# Patient Record
Sex: Male | Born: 1990 | Race: White | Hispanic: Yes | Marital: Single | Smoking: Never smoker
Health system: Southern US, Community
[De-identification: ages and names within clinical notes are randomized; demographics above are authoritative.]

---
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
## 2020-05-14 ENCOUNTER — Other Ambulatory Visit: Payer: Self-pay

## 2020-05-14 ENCOUNTER — Emergency Department
Admission: EM | Admit: 2020-05-14 | Discharge: 2020-05-14 | Disposition: A | Discharge status: Home / Self Care | Payer: 59 | Attending: Emergency Medicine | Admitting: Emergency Medicine

## 2020-05-14 ENCOUNTER — Encounter: Payer: Self-pay | Admitting: Emergency Medicine

## 2020-05-14 DIAGNOSIS — Y93G3 Activity, cooking and baking: Secondary | ICD-10-CM | POA: Insufficient documentation

## 2020-05-14 DIAGNOSIS — Z23 Encounter for immunization: Secondary | ICD-10-CM | POA: Diagnosis not present

## 2020-05-14 DIAGNOSIS — S61012A Laceration without foreign body of left thumb without damage to nail, initial encounter: Secondary | ICD-10-CM | POA: Diagnosis not present

## 2020-05-14 DIAGNOSIS — W260XXA Contact with knife, initial encounter: Secondary | ICD-10-CM | POA: Diagnosis not present

## 2020-05-14 DIAGNOSIS — S6992XA Unspecified injury of left wrist, hand and finger(s), initial encounter: Secondary | ICD-10-CM | POA: Diagnosis present

## 2020-05-14 MED ORDER — TETANUS-DIPHTH-ACELL PERTUSSIS 5-2.5-18.5 LF-MCG/0.5 IM SUSY
0.5000 mL | PREFILLED_SYRINGE | Freq: Once | INTRAMUSCULAR | Status: AC
  Administered 2020-05-14: 0.5 mL via INTRAMUSCULAR
  Filled 2020-05-14: qty 0.5

## 2020-05-14 NOTE — ED Triage Notes (Signed)
Pt to ED from home c/o left thumb laceration with a knife tonight while cooking dinner.  Thumb wrapped and bleeding controlled in wrap at this time.  Out of date for tetanus shot.  Bleeding continues when unwrapped, tip of finger injured.

## 2020-05-14 NOTE — ED Provider Notes (Signed)
[facility]-EMERGENCY DEPARTMENT  ____________________________________________  Time seen: Approximately 9:41 PM  I have reviewed the triage vital signs and the nursing notes.   HISTORY  Chief Complaint Laceration   Historian Patient     HPI Gerald Gilbert is a 29 y.o. male presents to the emergency department with a 0.5 cm left thumb avulsion sustained accidentally with a clean knife while cooking dinner.  No numbness or tingling in the left hand.  Patient reports injury was accidental and he cannot remember his last tetanus shot.  No other alleviating measures have been attempted.   History reviewed. No pertinent past medical history.   Immunizations up to date:  Yes.     History reviewed. No pertinent past medical history.  There are no problems to display for this patient.   History reviewed. No pertinent surgical history.  Prior to Admission medications   Not on File    Allergies Patient has no known allergies.  History reviewed. No pertinent family history.  Social History Social History   Tobacco Use  . Smoking status: Never Smoker  . Smokeless tobacco: Never Used  Substance Use Topics  . Alcohol use: Yes    Comment: occ.  . Drug use: Never     Review of Systems  Constitutional: No fever/chills Eyes:  No discharge ENT: No upper respiratory complaints. Respiratory: no cough. No SOB/ use of accessory muscles to breath Gastrointestinal:   No nausea, no vomiting.  No diarrhea.  No constipation. Musculoskeletal: Negative for musculoskeletal pain. Skin: Patient has thumb avulsion.     ____________________________________________   PHYSICAL EXAM:  VITAL SIGNS: ED Triage Vitals  Enc Vitals Group     BP 05/14/20 1954 (!) 149/76     Pulse Rate 05/14/20 1954 67     Resp 05/14/20 1954 16     Temp 05/14/20 1954 99 F (37.2 C)     Temp Source 05/14/20 1954 Oral     SpO2 05/14/20 1954 98 %     Weight 05/14/20 1956 230 lb (104.3 kg)     Height  05/14/20 1956 5\' 7"  (1.702 m)     Head Circumference --      Peak Flow --      Pain Score 05/14/20 1955 5     Pain Loc --      Pain Edu? --      Excl. in GC? --      Constitutional: Alert and oriented. Well appearing and in no acute distress. Eyes: Conjunctivae are normal. PERRL. EOMI. Head: Atraumatic. Cardiovascular: Normal rate, regular rhythm. Normal S1 and S2.  Good peripheral circulation. Respiratory: Normal respiratory effort without tachypnea or retractions. Lungs CTAB. Good air entry to the bases with no decreased or absent breath sounds Gastrointestinal: Bowel sounds x 4 quadrants. Soft and nontender to palpation. No guarding or rigidity. No distention. Musculoskeletal: Full range of motion to all extremities. No obvious deformities noted. No flexor or extensor tendon deficits appreciated of the left thumb. Neurologic:  Normal for age. No gross focal neurologic deficits are appreciated.  Skin: Patient has a 0.5 cm left thumb avulsion. Laceration is deep to underlying dermis.  Palpable radial pulse, left.  Capillary refill less than 2 seconds on the left. Psychiatric: Mood and affect are normal for age. Speech and behavior are normal.   ____________________________________________   LABS (all labs ordered are listed, but only abnormal results are displayed)  Labs Reviewed - No data to display ____________________________________________  EKG   ____________________________________________  RADIOLOGY  No  results found.  ____________________________________________    PROCEDURES  Procedure(s) performed:     Marland KitchenMarland KitchenLaceration Repair  Date/Time: 05/14/2020 10:51 PM Performed by: Orvil Feil, PA-C Authorized by: Orvil Feil, PA-C   Consent:    Consent obtained:  Verbal   Consent given by:  Patient Anesthesia:    Anesthesia method:  None Laceration details:    Location:  Finger   Finger location:  L thumb   Length (cm):  0.5   Depth (mm):   5 Exploration:    Contaminated: no   Treatment:    Amount of cleaning:  Standard   Irrigation solution:  Sterile saline   Visualized foreign bodies/material removed: no   Skin repair:    Repair method:  Tissue adhesive Approximation:    Approximation:  Close Repair type:    Repair type:  Simple Post-procedure details:    Dressing:  Open (no dressing)   Procedure completion:  Tolerated well, no immediate complications       Medications  Tdap (BOOSTRIX) injection 0.5 mL (0.5 mLs Intramuscular Given 05/14/20 2127)     ____________________________________________   INITIAL IMPRESSION / ASSESSMENT AND PLAN / ED COURSE  Pertinent labs & imaging results that were available during my care of the patient were reviewed by me and considered in my medical decision making (see chart for details).     Assessment and plan Left thumb laceration 29 year old male presents to the emergency department with a 0.5 cm avulsion from left thumb that was sustained accidentally.  A finger turnicot was placed on patient's digit and a small amount of Dermabond was applied after approximately 10 minutes.  Bleeding was well controlled after Dermabond placement.  Recommended Tylenol and ibuprofen alternating for discomfort.  Patient's tetanus status was updated prior to discharge.      ____________________________________________  FINAL CLINICAL IMPRESSION(S) / ED DIAGNOSES  Final diagnoses:  Laceration of left thumb without foreign body without damage to nail, initial encounter      NEW MEDICATIONS STARTED DURING THIS VISIT:  ED Discharge Orders    None          This chart was dictated using voice recognition software/Dragon. Despite best efforts to proofread, errors can occur which can change the meaning. Any change was purely unintentional.     Orvil Feil, PA-C 05/14/20 2253    Phineas Semen, MD 05/20/20 [phone]
